# Patient Record
Sex: Female | Born: 2000 | Race: Black or African American | Hispanic: No | Marital: Single | Smoking: Current some day smoker
Health system: Southern US, Community
[De-identification: ages and names within clinical notes are randomized; demographics above are authoritative.]

## PROBLEM LIST (undated history)

## (undated) DIAGNOSIS — N289 Disorder of kidney and ureter, unspecified: Secondary | ICD-10-CM

## (undated) DIAGNOSIS — R011 Cardiac murmur, unspecified: Secondary | ICD-10-CM

## (undated) DIAGNOSIS — M1990 Unspecified osteoarthritis, unspecified site: Secondary | ICD-10-CM

## (undated) DIAGNOSIS — M419 Scoliosis, unspecified: Secondary | ICD-10-CM

## (undated) HISTORY — PX: CYSTOSCOPY: SUR368

---
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
## 2019-08-03 ENCOUNTER — Encounter: Payer: Self-pay | Admitting: Physician Assistant

## 2019-08-03 ENCOUNTER — Ambulatory Visit
Admission: EM | Admit: 2019-08-03 | Discharge: 2019-08-03 | Disposition: A | Discharge status: Home / Self Care | Payer: Medicaid Other | Attending: Physician Assistant | Admitting: Physician Assistant

## 2019-08-03 ENCOUNTER — Other Ambulatory Visit: Payer: Self-pay

## 2019-08-03 ENCOUNTER — Emergency Department (HOSPITAL_COMMUNITY)
Admission: EM | Admit: 2019-08-03 | Discharge: 2019-08-03 | Discharge status: Against Medical Advice | Payer: Medicaid Other

## 2019-08-03 DIAGNOSIS — J0140 Acute pansinusitis, unspecified: Secondary | ICD-10-CM | POA: Diagnosis not present

## 2019-08-03 HISTORY — DX: Scoliosis, unspecified: M41.9

## 2019-08-03 HISTORY — DX: Unspecified osteoarthritis, unspecified site: M19.90

## 2019-08-03 HISTORY — DX: Disorder of kidney and ureter, unspecified: N28.9

## 2019-08-03 HISTORY — DX: Cardiac murmur, unspecified: R01.1

## 2019-08-03 MED ORDER — IPRATROPIUM BROMIDE 0.06 % NA SOLN
2.0000 | Freq: Four times a day (QID) | NASAL | 0 refills | Status: AC
Start: 2019-08-03 — End: ?

## 2019-08-03 MED ORDER — AMOXICILLIN-POT CLAVULANATE 875-125 MG PO TABS: 1.0000 | ORAL_TABLET | Freq: Two times a day (BID) | ORAL | 0 refills | Status: DC

## 2019-08-03 MED ORDER — FLUTICASONE PROPIONATE 50 MCG/ACT NA SUSP: 2.0000 | Freq: Every day | NASAL | 0 refills | Status: AC

## 2019-08-03 NOTE — ED Triage Notes (Signed)
Pt c.o cough, congestion, and body aches x2wks

## 2019-08-03 NOTE — Discharge Instructions (Signed)
Start augmentin as directed. Start flonase, atrovent nasal spray for nasal congestion/drainage. You can use over the counter nasal saline rinse such as neti pot for nasal congestion. Keep hydrated, your urine should be clear to pale yellow in color. Tylenol/motrin for fever and pain. Monitor for any worsening of symptoms, chest pain, shortness of breath, wheezing, swelling of the throat, go to the emergency department for further evaluation needed.

## 2019-08-03 NOTE — ED Provider Notes (Signed)
[facility]    CSN: [id number] Arrival date & time: 08/03/19  1517      History   Chief Complaint Chief Complaint  Patient presents with   Cough    HPI Rebecca Whitney is a 19 y.o. female.   19 year old female comes in for 2+ week history of URI symptoms. Rhinorrhea, nasal congestion, cough, back pain. Denies fever, chills, body aches. Denies abdominal pain, nausea, vomiting, diarrhea. Denies shortness of breath, loss of taste/smell. States now mucous discolored. No COVID vaccine.       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   Past Surgical History:  Procedure Laterality Date   CYSTOSCOPY      OB History   No obstetric history on file.      Home Medications    Prior to Admission medications   Medication Sig Start Date End Date Taking? Authorizing Provider  amoxicillin-clavulanate (AUGMENTIN) 875-125 MG tablet Take 1 tablet by mouth every 12 (twelve) hours. 08/03/19   Cathie Hoops, Jenaye Rickert V, PA-C  fluticasone (FLONASE) 50 MCG/ACT nasal spray Place 2 sprays into both nostrils daily. 08/03/19   Cathie Hoops, Ryne Mctigue V, PA-C  ipratropium (ATROVENT) 0.06 % nasal spray Place 2 sprays into both nostrils 4 (four) times daily. 08/03/19   Cathie Hoops, Letonya Mangels V, PA-C  loratadine (CLARITIN) 10 MG tablet Take 10 mg by mouth daily.  08/03/19  [provider]    Family History History reviewed. No pertinent family history.  Social History Social History   Tobacco Use   Smoking status: Current Some Day Smoker    Types: E-cigarettes   Smokeless tobacco: Never Used  Substance Use Topics   Alcohol use: No   Drug use: Never     Allergies   Patient has no known allergies.   Review of Systems Review of Systems  Reason unable to perform ROS: See HPI as above.     Physical Exam Triage Vital Signs ED Triage Vitals [08/03/19 1601]  Enc  Vitals Group     BP 117/75     Pulse Rate 63     Resp 16     Temp 97.7 F (36.5 C)     Temp Source Oral     SpO2 98 %     Weight      Height      Head Circumference      Peak Flow      Pain Score      Pain Loc      Pain Edu?      Excl. in GC?    No data found.  Updated Vital Signs BP 117/75 (BP Location: Left Arm)    Pulse 63    Temp 97.7 F (36.5 C) (Oral)    Resp 16    LMP 07/20/2019    SpO2 98%   Physical Exam Constitutional:      General: She is not in acute distress.    Appearance: Normal appearance. She is well-developed. She is not ill-appearing, toxic-appearing or diaphoretic.  HENT:     Head: Normocephalic and atraumatic.     Nose: Congestion and rhinorrhea present.     Right Sinus: Maxillary sinus tenderness and frontal sinus tenderness present.     Left Sinus: Maxillary sinus tenderness and frontal sinus tenderness present.  Mouth/Throat:     Mouth: Mucous membranes are moist.     Pharynx: Oropharynx is clear. Uvula midline.  Eyes:     Conjunctiva/sclera: Conjunctivae normal.     Pupils: Pupils are equal, round, and reactive to light.  Cardiovascular:     Rate and Rhythm: Normal rate and regular rhythm.  Pulmonary:     Effort: Pulmonary effort is normal. No accessory muscle usage, prolonged expiration, respiratory distress or retractions.     Breath sounds: No decreased air movement or transmitted upper airway sounds. No decreased breath sounds.     Comments: LCTAB Musculoskeletal:     Cervical back: Normal range of motion and neck supple.  Skin:    General: Skin is warm and dry.  Neurological:     Mental Status: She is alert and oriented to person, place, and time.      UC Treatments / Results  Labs (all labs ordered are listed, but only abnormal results are displayed) Labs Reviewed - No data to display  EKG   Radiology No results found.  Procedures Procedures (including critical care time)  Medications Ordered in UC Medications - No  data to display  Initial Impression / Assessment and Plan / UC Course  I have reviewed the triage vital signs and the nursing notes.  Pertinent labs & imaging results that were available during my care of the patient were reviewed by me and considered in my medical decision making (see chart for details).    Augmentin for sinusitis. Other symptomatic treatment discussed. Return precautions given.   Final Clinical Impressions(s) / UC Diagnoses   Final diagnoses:  Acute non-recurrent pansinusitis    ED Prescriptions    Medication Sig Dispense Auth. Provider   amoxicillin-clavulanate (AUGMENTIN) 875-125 MG tablet Take 1 tablet by mouth every 12 (twelve) hours. 14 tablet Darleth Eustache V, PA-C   fluticasone (FLONASE) 50 MCG/ACT nasal spray Place 2 sprays into both nostrils daily. 1 g Gizella Belleville V, PA-C   ipratropium (ATROVENT) 0.06 % nasal spray Place 2 sprays into both nostrils 4 (four) times daily. 15 mL Belinda Fisher, PA-C     PDMP not reviewed this encounter.   Belinda Fisher, PA-C 08/03/19 [phone]

## 2021-11-25 ENCOUNTER — Ambulatory Visit
Admission: EM | Admit: 2021-11-25 | Discharge: 2021-11-25 | Disposition: A | Discharge status: Home / Self Care | Payer: Medicaid Other | Attending: Physician Assistant | Admitting: Physician Assistant

## 2021-11-25 DIAGNOSIS — J209 Acute bronchitis, unspecified: Secondary | ICD-10-CM | POA: Insufficient documentation

## 2021-11-25 DIAGNOSIS — Z1152 Encounter for screening for COVID-19: Secondary | ICD-10-CM | POA: Insufficient documentation

## 2021-11-25 DIAGNOSIS — J019 Acute sinusitis, unspecified: Secondary | ICD-10-CM | POA: Diagnosis present

## 2021-11-25 MED ORDER — AMOXICILLIN-POT CLAVULANATE 875-125 MG PO TABS: 1.0000 | ORAL_TABLET | Freq: Two times a day (BID) | ORAL | 0 refills | Status: DC

## 2021-11-25 MED ORDER — PREDNISONE 20 MG PO TABS: 40.0000 mg | ORAL_TABLET | Freq: Every day | ORAL | 0 refills | Status: AC

## 2021-11-25 NOTE — ED Provider Notes (Signed)
[facility]    CSN: [id number] Arrival date & time: 11/25/21  1738      History   Chief Complaint Chief Complaint  Patient presents with   Nasal Congestion    HPI Rebecca Whitney is a 21 y.o. female.   Patient here today for evaluation of nasal congestion that she has had for the last 20 days.  She reports that she has had some cough as well and this worsened over the last few days.  She denies any persistent nausea or vomiting but states she did vomit yesterday.  She also notes [place] before symptoms worsen.  She has low-grade fever in office but has not had fever otherwise.  She has taken hydrocodone today as well as amoxicillin she had leftover but no other over-the-counter medication.  The history is provided by the patient.    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   Past Surgical History:  Procedure Laterality Date   CYSTOSCOPY      OB History   No obstetric history on file.      Home Medications    Prior to Admission medications   Medication Sig Start Date End Date Taking? Authorizing Provider  amoxicillin-clavulanate (AUGMENTIN) 875-125 MG tablet Take 1 tablet by mouth every 12 (twelve) hours. 11/25/21  Yes Tomi Bamberger, PA-C  cyclobenzaprine (FLEXERIL) 5 MG tablet  12/10/20  Yes [provider]  gabapentin (NEURONTIN) 300 MG capsule Take by mouth. 09/11/21  Yes [provider]  LO LOESTRIN FE 1 MG-10 MCG / 10 MCG tablet Take 1 tablet by mouth daily. 10/04/21  Yes [provider]  methocarbamol (ROBAXIN) 500 MG tablet Take by mouth. 09/10/21  Yes [provider]  predniSONE (DELTASONE) 20 MG tablet Take 2 tablets (40 mg total) by mouth daily with breakfast for 5 days. 11/25/21 11/30/21 Yes Tomi Bamberger, PA-C  sertraline (ZOLOFT) 25 MG  tablet  12/11/20  Yes [provider]  fluticasone (FLONASE) 50 MCG/ACT nasal spray Place 2 sprays into both nostrils daily. 08/03/19   Belinda Fisher, PA-C  HYDROcodone-acetaminophen (NORCO/VICODIN) 5-325 MG tablet Take 1 tablet by mouth 4 (four) times daily as needed.    [provider]  ipratropium (ATROVENT) 0.06 % nasal spray Place 2 sprays into both nostrils 4 (four) times daily. 08/03/19   Cathie Hoops, Amy V, PA-C  loratadine (CLARITIN) 10 MG tablet Take 10 mg by mouth daily.  08/03/19  [provider]    Family History History reviewed. No pertinent family history.  Social History Social History   Tobacco Use   Smoking status: Some Days    Types: E-cigarettes   Smokeless tobacco: Never  Substance Use Topics   Alcohol use: No   Drug use: Never     Allergies   Patient has no known allergies.   Review of Systems Review of Systems  Constitutional:  Positive for fever. Negative for chills.  HENT:  Positive for congestion and sore throat. Negative for ear pain.   Eyes:  Negative for discharge and redness.  Respiratory:  Positive for cough. Negative for shortness of breath and wheezing.   Gastrointestinal:  Positive for vomiting. Negative for abdominal pain, diarrhea and nausea.     Physical Exam Triage Vital Signs ED  Triage Vitals [11/25/21 1821]  Enc Vitals Group     BP 95/63     Pulse Rate (!) 110     Resp 16     Temp 99.9 F (37.7 C)     Temp Source Oral     SpO2 97 %     Weight      Height      Head Circumference      Peak Flow      Pain Score 6     Pain Loc      Pain Edu?      Excl. in GC?    No data found.  Updated Vital Signs BP 95/63 (BP Location: Right Arm)   Pulse (!) 110   Temp 99.9 F (37.7 C) (Oral)   Resp 16   SpO2 97%     Physical Exam Vitals and nursing note reviewed.  Constitutional:      General: She is not in acute distress.    Appearance: Normal appearance. She is not ill-appearing.  HENT:     Head: Normocephalic  and atraumatic.     Nose: Congestion present.     Mouth/Throat:     Mouth: Mucous membranes are moist.     Pharynx: No oropharyngeal exudate or posterior oropharyngeal erythema.  Eyes:     Conjunctiva/sclera: Conjunctivae normal.  Cardiovascular:     Rate and Rhythm: Normal rate and regular rhythm.     Heart sounds: Normal heart sounds. No murmur heard. Pulmonary:     Effort: Pulmonary effort is normal. No respiratory distress.     Breath sounds: Normal breath sounds. No wheezing, rhonchi or rales.  Skin:    General: Skin is warm and dry.  Neurological:     Mental Status: She is alert.  Psychiatric:        Mood and Affect: Mood normal.        Thought Content: Thought content normal.      UC Treatments / Results  Labs (all labs ordered are listed, but only abnormal results are displayed) Labs Reviewed  RESP PANEL BY RT-PCR (FLU A&B, COVID) [id number]    EKG   Radiology No results found.  Procedures Procedures (including critical care time)  Medications Ordered in UC Medications - No data to display  Initial Impression / Assessment and Plan / UC Course  I have reviewed the triage vital signs and the nursing notes.  Pertinent labs & imaging results that were available during my care of the patient were reviewed by me and considered in my medical decision making (see chart for details).    We will treat to cover both sinusitis and bronchitis with Augmentin and prednisone.  Discussed possibility of additional viral illness given worsening symptoms over the last 2 days with associated fever.  Will order COVID and flu screening.  Recommended symptomatic treatment otherwise with follow-up with any further concerns.  Final Clinical Impressions(s) / UC Diagnoses   Final diagnoses:  Acute sinusitis, recurrence not specified, unspecified location  Acute bronchitis, unspecified organism  Encounter for screening for COVID-19   Discharge Instructions   None    ED  Prescriptions     Medication Sig Dispense Auth. Provider   predniSONE (DELTASONE) 20 MG tablet Take 2 tablets (40 mg total) by mouth daily with breakfast for 5 days. 10 tablet Tomi Bamberger, PA-C   amoxicillin-clavulanate (AUGMENTIN) 875-125 MG tablet Take 1 tablet by mouth every 12 (twelve) hours. 14 tablet Tomi Bamberger, PA-C  PDMP not reviewed this encounter.   Tomi Bamberger, PA-C 11/25/21 1927

## 2021-11-25 NOTE — ED Triage Notes (Signed)
Pt c/o cough, runny nose, sneezing for 20 days. States cough worsened over last 2 days with more nasal congestion.

## 2021-11-26 LAB — RESP PANEL BY RT-PCR (FLU A&B, COVID) ARPGX2
Influenza A by PCR: NEGATIVE
Influenza B by PCR: NEGATIVE
SARS Coronavirus 2 by RT PCR: NEGATIVE

## 2022-05-12 ENCOUNTER — Ambulatory Visit
Admission: EM | Admit: 2022-05-12 | Discharge: 2022-05-12 | Disposition: A | Discharge status: Home / Self Care | Payer: Medicaid Other | Attending: Internal Medicine | Admitting: Internal Medicine

## 2022-05-12 DIAGNOSIS — J069 Acute upper respiratory infection, unspecified: Secondary | ICD-10-CM | POA: Diagnosis not present

## 2022-05-12 MED ORDER — BENZONATATE 100 MG PO CAPS: 100.0000 mg | ORAL_CAPSULE | Freq: Three times a day (TID) | ORAL | 0 refills | Status: DC

## 2022-05-12 MED ORDER — PROMETHAZINE-DM 6.25-15 MG/5ML PO SYRP: 5.0000 mL | ORAL_SOLUTION | Freq: Every evening | ORAL | 0 refills | Status: DC | PRN

## 2022-05-12 NOTE — Discharge Instructions (Addendum)
You have a viral upper respiratory infection.   Use the following medicines to help with symptoms: - Plain Mucinex (guaifenesin) over the counter as directed every 12 hours to thin mucous so that you are able to get it out of your body easier. Drink plenty of water while taking this medication so that it works well in your body (at least 8 cups a day).  - Tylenol 1,000mg and/or ibuprofen 600mg every 6 hours with food as needed for aches/pains or fever/chills.  - Tessalon perles every 8 hours as needed for cough.  1 tablespoon of honey in warm water and/or salt water gargles may also help with symptoms. Humidifier to your room will help add water to the air and reduce coughing.  If you develop any new or worsening symptoms, please return.  If your symptoms are severe, please go to the emergency room.  Follow-up with your primary care provider for further evaluation and management of your symptoms as well as ongoing wellness visits.  I hope you feel better!  

## 2022-05-12 NOTE — ED Provider Notes (Signed)
[facility]    CSN: [id number] Arrival date & time: 05/12/22  1618      History   Chief Complaint Chief Complaint  Patient presents with   Cough    HPI Rebecca Whitney is a 22 y.o. female.   Patient presents to urgent care for evaluation of cough, nasal congestion, chills, generalized fatigue, and sore throat for the last 3 days. 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  Cough is productive with phlegm but worse at nighttime.  No shortness of breath, chest pain, and/V/D, abdominal pain, rash, or palpitations, recent antibiotic/steroid use, or history of chronic respiratory problems.  Non-smoker, denies drug use.  She has been using over-the-counter medications to help with symptoms before coming to urgent care with some relief.   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   Past Surgical History:  Procedure Laterality Date   CYSTOSCOPY      OB History   No obstetric history on file.      Home Medications    Prior to Admission medications   Medication Sig Start Date End Date Taking? Authorizing Provider  benzonatate (TESSALON) 100 MG capsule Take 1 capsule (100 mg total) by mouth every 8 (eight) hours. 05/12/22  Yes Carlisle Beers, FNP  promethazine-dextromethorphan (PROMETHAZINE-DM) 6.25-15 MG/5ML syrup Take 5 mLs by mouth at bedtime as needed for cough. 05/12/22  Yes Carlisle Beers, FNP  amoxicillin-clavulanate (AUGMENTIN) 875-125 MG tablet Take 1 tablet by mouth every 12 (twelve) hours. 11/25/21   Tomi Bamberger, PA-C  cyclobenzaprine (FLEXERIL) 5 MG tablet  12/10/20   [provider]  fluticasone (FLONASE) 50 MCG/ACT nasal spray Place 2 sprays into both nostrils daily. 08/03/19   Cathie Hoops, Amy V, PA-C  gabapentin (NEURONTIN) 300 MG capsule Take by mouth.  09/11/21   [provider]  HYDROcodone-acetaminophen (NORCO/VICODIN) 5-325 MG tablet Take 1 tablet by mouth 4 (four) times daily as needed.    [provider]  ipratropium (ATROVENT) 0.06 % nasal spray Place 2 sprays into both nostrils 4 (four) times daily. 08/03/19   Yu, Amy V, PA-C  LO LOESTRIN FE 1 MG-10 MCG / 10 MCG tablet Take 1 tablet by mouth daily. 10/04/21   [provider]  methocarbamol (ROBAXIN) 500 MG tablet Take by mouth. 09/10/21   [provider]  sertraline (ZOLOFT) 25 MG tablet  12/11/20   [provider]  loratadine (CLARITIN) 10 MG tablet Take 10 mg by mouth daily.  08/03/19  [provider]    Family History History reviewed. No pertinent family history.  Social History Social History   Tobacco Use   Smoking status: Some Days    Types: E-cigarettes   Smokeless tobacco: Never  Substance Use Topics   Alcohol use: No   Drug use: Never     Allergies   Patient has no known allergies.   Review of Systems Review of Systems  Respiratory:  Positive for cough.   Per HPI   Physical Exam Triage Vital Signs ED Triage Vitals  Enc Vitals Group     BP 05/12/22 1644 120/80     Pulse Rate 05/12/22 1644 100     Resp 05/12/22 1644 16     Temp  05/12/22 1644 98.4 F (36.9 C)     Temp Source 05/12/22 1644 Oral     SpO2 05/12/22 1644 97 %     Weight --      Height --      Head Circumference --      Peak Flow --      Pain Score 05/12/22 1645 0     Pain Loc --      Pain Edu? --      Excl. in GC? --    No data found.  Updated Vital Signs BP 120/80 (BP Location: Right Arm)   Pulse 100   Temp 98.4 F (36.9 C) (Oral)   Resp 16   SpO2 97%   Visual Acuity Right Eye Distance:   Left Eye Distance:   Bilateral Distance:    Right Eye Near:   Left Eye Near:    Bilateral Near:     Physical Exam Vitals and nursing note reviewed.  Constitutional:      Appearance: She is not ill-appearing or toxic-appearing.   HENT:     Head: Normocephalic and atraumatic.     Right Ear: Hearing, tympanic membrane, ear canal and external ear normal.     Left Ear: Hearing, tympanic membrane, ear canal and external ear normal.     Nose: Congestion present.     Mouth/Throat:     Lips: Pink.     Mouth: Mucous membranes are moist. No injury.     Tongue: No lesions. Tongue does not deviate from midline.     Palate: No mass and lesions.     Pharynx: Oropharynx is clear. Uvula midline. Posterior oropharyngeal erythema present. No pharyngeal swelling, oropharyngeal exudate or uvula swelling.     Tonsils: No tonsillar exudate or tonsillar abscesses.  Eyes:     General: Lids are normal. Vision grossly intact. Gaze aligned appropriately.     Extraocular Movements: Extraocular movements intact.     Conjunctiva/sclera: Conjunctivae normal.  Cardiovascular:     Rate and Rhythm: Normal rate and regular rhythm.     Heart sounds: Normal heart sounds, S1 normal and S2 normal.  Pulmonary:     Effort: Pulmonary effort is normal. No respiratory distress.     Breath sounds: Normal breath sounds and air entry. No stridor. No wheezing, rhonchi or rales.  Chest:     Chest wall: No tenderness.  Musculoskeletal:     Cervical back: Neck supple.     Right lower leg: No edema.     Left lower leg: No edema.  Lymphadenopathy:     Cervical: No cervical adenopathy.  Skin:    General: Skin is warm and dry.     Capillary Refill: Capillary refill takes less than 2 seconds.     Findings: No rash.  Neurological:     General: No focal deficit present.     Mental Status: She is alert and oriented to person, place, and time. Mental status is at baseline.     Cranial Nerves: No dysarthria or facial asymmetry.  Psychiatric:        Mood and Affect: Mood normal.        Speech: Speech normal.        Behavior: Behavior normal.        Thought Content: Thought content normal.        Judgment: Judgment normal.      UC Treatments / Results   Labs (all labs ordered are listed, but only abnormal results are displayed) Labs  Reviewed - No data to display  EKG   Radiology No results found.  Procedures Procedures (including critical care time)  Medications Ordered in UC Medications - No data to display  Initial Impression / Assessment and Plan / UC Course  I have reviewed the triage vital signs and the nursing notes.  Pertinent labs & imaging results that were available during my care of the patient were reviewed by me and considered in my medical decision making (see chart for details).   1.  Viral URI with cough Symptoms and physical exam consistent with a viral upper respiratory tract infection that will likely resolve with rest, fluids, and prescriptions for symptomatic relief. Deferred imaging based on stable cardiopulmonary exam and hemodynamically stable vital signs. Deferred viral testing using shared decision making as patient is low risk for severe disease related to potential COVID-19 illness and there is low suspicion for influenza. Discussed updated CDC guidelines for masking related to COVID-19.   Tessalon Perles and Promethazine DM sent to pharmacy for symptomatic relief to be taken as prescribed.  May continue taking over the counter medications as directed for further symptomatic relief.  Drowsiness precautions discussed regarding promethazine DM prescription.  Nonpharmacologic interventions for symptom relief provided and after visit summary below. Advised to push fluids to stay well hydrated while recovering from viral illness.   Discussed physical exam and available lab work findings in clinic with patient.  Counseled patient regarding appropriate use of medications and potential side effects for all medications recommended or prescribed today. Discussed red flag signs and symptoms of worsening condition,when to call the PCP office, return to urgent care, and when to seek higher level of care in the emergency  department. Patient verbalizes understanding and agreement with plan. All questions answered. Patient discharged in stable condition.    Final Clinical Impressions(s) / UC Diagnoses   Final diagnoses:  Viral URI with cough     Discharge Instructions      You have a viral upper respiratory infection.   Use the following medicines to help with symptoms: - Plain Mucinex (guaifenesin) over the counter as directed every 12 hours to thin mucous so that you are able to get it out of your body easier. Drink plenty of water while taking this medication so that it works well in your body (at least 8 cups a day).  - Tylenol 1,000mg  and/or ibuprofen 600mg  every 6 hours with food as needed for aches/pains or fever/chills.  - Tessalon perles every 8 hours as needed for cough.  1 tablespoon of honey in warm water and/or salt water gargles may also help with symptoms. Humidifier to your room will help add water to the air and reduce coughing.  If you develop any new or worsening symptoms, please return.  If your symptoms are severe, please go to the emergency room.  Follow-up with your primary care provider for further evaluation and management of your symptoms as well as ongoing wellness visits.  I hope you feel better!   ED Prescriptions     Medication Sig Dispense Auth. Provider   benzonatate (TESSALON) 100 MG capsule Take 1 capsule (100 mg total) by mouth every 8 (eight) hours. 21 capsule Carlisle Beers, FNP   promethazine-dextromethorphan (PROMETHAZINE-DM) 6.25-15 MG/5ML syrup Take 5 mLs by mouth at bedtime as needed for cough. 118 mL Carlisle Beers, FNP      PDMP not reviewed this encounter.   Carlisle Beers, [place] 05/12/22 1727

## 2022-05-12 NOTE — ED Triage Notes (Signed)
Pt c/o cough onset ~ 3 weeks ago states over the last 1-2 days it has gotten worse. States new sxs include body aches, headaches. Reports taking hydrocodone "which really helps."

## 2023-02-19 ENCOUNTER — Ambulatory Visit
Admission: EM | Admit: 2023-02-19 | Discharge: 2023-02-19 | Disposition: A | Discharge status: Home / Self Care | Payer: Medicaid Other | Attending: Family Medicine | Admitting: Family Medicine

## 2023-02-19 DIAGNOSIS — H60502 Unspecified acute noninfective otitis externa, left ear: Secondary | ICD-10-CM | POA: Diagnosis not present

## 2023-02-19 MED ORDER — NEOMYCIN-POLYMYXIN-HC 3.5-10000-1 OT SUSP: 4.0000 [drp] | Freq: Three times a day (TID) | OTIC | 0 refills | Status: AC

## 2023-02-19 NOTE — ED Triage Notes (Signed)
Pt presents with ear pain in left ear. Symptoms onset 3 to 4 days ago. Pt states earlier she could not hear out of the ear.

## 2023-02-19 NOTE — ED Provider Notes (Signed)
[facility]    CSN: [id number] Arrival date & time: 02/19/23  1526      History   Chief Complaint Chief Complaint  Patient presents with   Otalgia    HPI Rebecca Whitney is a 23 y.o. female.  Patient presents today with a concern for an ear infection.  Patient endorses that she has had left ear pain and intermittent of periods hearing loss.  Patient has had mild upper respiratory symptoms on and off for a while.  Ear pain has been ongoing for 3 days.  She has had no fever.  She has no history of recurrent ear infections.   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   Past Surgical History:  Procedure Laterality Date   CYSTOSCOPY      OB History   No obstetric history on file.      Home Medications    Prior to Admission medications   Medication Sig Start Date End Date Taking? Authorizing Provider  neomycin-polymyxin-hydrocortisone (CORTISPORIN) 3.5-10000-1 OTIC suspension Place 4 drops into the left ear 3 (three) times daily for 7 days. 02/19/23 02/26/23 Yes Bing Neighbors, NP  amoxicillin-clavulanate (AUGMENTIN) 875-125 MG tablet Take 1 tablet by mouth every 12 (twelve) hours. 11/25/21   Tomi Bamberger, PA-C  benzonatate (TESSALON) 100 MG capsule Take 1 capsule (100 mg total) by mouth every 8 (eight) hours. 05/12/22   Carlisle Beers, FNP  cyclobenzaprine (FLEXERIL) 5 MG tablet  12/10/20   [provider]  fluticasone (FLONASE) 50 MCG/ACT nasal spray Place 2 sprays into both nostrils daily. 08/03/19   Cathie Hoops, Amy V, PA-C  gabapentin (NEURONTIN) 300 MG capsule Take by mouth. 09/11/21   [provider]  HYDROcodone-acetaminophen (NORCO/VICODIN) 5-325 MG tablet Take 1 tablet by mouth 4 (four) times daily as needed.    [provider]  ipratropium (ATROVENT) 0.06 % nasal spray Place 2 sprays into  both nostrils 4 (four) times daily. 08/03/19   Yu, Amy V, PA-C  LO LOESTRIN FE 1 MG-10 MCG / 10 MCG tablet Take 1 tablet by mouth daily. 10/04/21   [provider]  methocarbamol (ROBAXIN) 500 MG tablet Take by mouth. 09/10/21   [provider]  promethazine-dextromethorphan (PROMETHAZINE-DM) 6.25-15 MG/5ML syrup Take 5 mLs by mouth at bedtime as needed for cough. 05/12/22   Carlisle Beers, FNP  sertraline (ZOLOFT) 25 MG tablet  12/11/20   [provider]  loratadine (CLARITIN) 10 MG tablet Take 10 mg by mouth daily.  08/03/19  [provider]    Family History No family history on file.  Social History Social History   Tobacco Use   Smoking status: Some Days    Types: E-cigarettes   Smokeless tobacco: Never  Substance Use Topics   Alcohol use: No   Drug use: Never     Allergies   Patient has no known allergies.   Review of Systems Review of Systems  HENT:  Positive for ear pain.      Physical Exam Triage Vital Signs ED Triage Vitals  Encounter Vitals Group     BP 02/19/23 1704 118/81     Systolic BP Percentile --      Diastolic BP Percentile --      Pulse Rate 02/19/23 1704 75  Resp 02/19/23 1704 18     Temp 02/19/23 1704 98.1 F (36.7 C)     Temp Source 02/19/23 1704 Oral     SpO2 02/19/23 1704 95 %     Weight --      Height --      Head Circumference --      Peak Flow --      Pain Score 02/19/23 1702 0     Pain Loc --      Pain Education --      Exclude from Growth Chart --    No data found.  Updated Vital Signs BP 118/81 (BP Location: Right Arm)   Pulse 75   Temp 98.1 F (36.7 C) (Oral)   Resp 18   LMP 02/05/2023   SpO2 95%   Visual Acuity Right Eye Distance:   Left Eye Distance:   Bilateral Distance:    Right Eye Near:   Left Eye Near:    Bilateral Near:     Physical Exam Vitals reviewed.  HENT:     Right Ear: Hearing, tympanic membrane, ear canal and external ear normal.     Left Ear: Swelling  and tenderness present. Tympanic membrane is erythematous.  Eyes:     Extraocular Movements: Extraocular movements intact.     Conjunctiva/sclera: Conjunctivae normal.     Pupils: Pupils are equal, round, and reactive to light.  Cardiovascular:     Rate and Rhythm: Normal rate and regular rhythm.  Pulmonary:     Effort: Pulmonary effort is normal.     Breath sounds: Normal breath sounds.  Musculoskeletal:        General: Normal range of motion.     Cervical back: Normal range of motion and neck supple.  Skin:    General: Skin is warm and dry.  Neurological:     General: No focal deficit present.     Mental Status: She is alert.      UC Treatments / Results  Labs (all labs ordered are listed, but only abnormal results are displayed) Labs Reviewed - No data to display  EKG   Radiology No results found.  Procedures Procedures (including critical care time)  Medications Ordered in UC Medications - No data to display  Initial Impression / Assessment and Plan / UC Course  I have reviewed the triage vital signs and the nursing notes.  Pertinent labs & imaging results that were available during my care of the patient were reviewed by me and considered in my medical decision making (see chart for details).    Acute otitis media involving the left ear.  Treatment with Cortisporin 4 drops to left ear 3 times daily for 7 days. Recommended cetirizine for nasal symptoms.  Return precautions given if symptoms worsen or do not improve. Final Clinical Impressions(s) / UC Diagnoses   Final diagnoses:  Acute otitis externa of left ear, unspecified type     Discharge Instructions      Need for a left infection.  No eardrops as directed.  Symptoms should gradually improve however complete entire 7 days of treatment.     ED Prescriptions     Medication Sig Dispense Auth. Provider   neomycin-polymyxin-hydrocortisone (CORTISPORIN) 3.5-10000-1 OTIC suspension Place 4 drops into  the left ear 3 (three) times daily for 7 days. 4.2 mL Bing Neighbors, NP      PDMP not reviewed this encounter.   Bing Neighbors, NP 02/19/23 1734

## 2023-02-19 NOTE — Discharge Instructions (Signed)
Need for a left infection.  No eardrops as directed.  Symptoms should gradually improve however complete entire 7 days of treatment.

## 2023-02-26 ENCOUNTER — Ambulatory Visit
Admission: RE | Admit: 2023-02-26 | Discharge: 2023-02-26 | Disposition: A | Discharge status: Home / Self Care | Payer: Medicaid Other | Source: Ambulatory Visit | Attending: Family Medicine | Admitting: Family Medicine

## 2023-02-26 VITALS — BP 97/66 | HR 107 | Temp 98.4°F | Resp 18 | Ht 70.0 in | Wt 140.0 lb

## 2023-02-26 DIAGNOSIS — J09X2 Influenza due to identified novel influenza A virus with other respiratory manifestations: Secondary | ICD-10-CM | POA: Diagnosis not present

## 2023-02-26 LAB — POCT URINALYSIS DIP (MANUAL ENTRY)
Bilirubin, UA: NEGATIVE
Blood, UA: NEGATIVE
Glucose, UA: NEGATIVE mg/dL
Leukocytes, UA: NEGATIVE
Nitrite, UA: NEGATIVE
Protein Ur, POC: 100 mg/dL — AB
Spec Grav, UA: 1.02 (ref 1.010–1.025)
Urobilinogen, UA: 1 U/dL
pH, UA: 6 (ref 5.0–8.0)

## 2023-02-26 LAB — POC COVID19/FLU A&B COMBO
Covid Antigen, POC: NEGATIVE
Influenza A Antigen, POC: POSITIVE — AB
Influenza B Antigen, POC: NEGATIVE

## 2023-02-26 MED ORDER — PROMETHAZINE-DM 6.25-15 MG/5ML PO SYRP: 5.0000 mL | ORAL_SOLUTION | Freq: Four times a day (QID) | ORAL | 0 refills | Status: AC | PRN

## 2023-02-26 MED ORDER — IBUPROFEN 600 MG PO TABS: 600.0000 mg | ORAL_TABLET | Freq: Three times a day (TID) | ORAL | 0 refills | Status: AC | PRN

## 2023-02-26 MED ORDER — OSELTAMIVIR PHOSPHATE 75 MG PO CAPS: 75.0000 mg | ORAL_CAPSULE | Freq: Two times a day (BID) | ORAL | 0 refills | Status: AC

## 2023-02-26 MED ORDER — ONDANSETRON 4 MG PO TBDP: 4.0000 mg | ORAL_TABLET | Freq: Three times a day (TID) | ORAL | 0 refills | Status: AC | PRN

## 2023-02-26 NOTE — Discharge Instructions (Addendum)
Your flu test is positive; COVID test was negative  Take oseltamivir 75 mg--1 capsule 2 times daily for 5 days  Ondansetron dissolved in the mouth every 8 hours as needed for nausea or vomiting. Clear liquids(water, gatorade/pedialyte, ginger ale/sprite, chicken broth/soup) and bland things(crackers/toast, rice, potato, bananas) to eat. Avoid acidic foods like lemon/lime/orange/tomato, and avoid greasy/spicy foods.  Take Phenergan with dextromethorphan syrup--5 mL or 1 teaspoon every 6 hours as needed for cough  Take ibuprofen 600 mg--1 tab every 8 hours as needed for pain.  The urine analysis did not show any signs of infection.

## 2023-02-26 NOTE — ED Provider Notes (Addendum)
[facility]    CSN: [id number] Arrival date & time: 02/26/23  1300      History   Chief Complaint Chief Complaint  Patient presents with   Cough    HPI Rebecca Whitney is a 23 y.o. female.    Cough Here for cough and congestion and sore throat.  She is also had some subjective fever and myalgia.  Symptoms began on February 17.  She has had some nausea but no vomiting.  No diarrhea.  Her left low back has been hurting and also the left side.  No dysuria no hematuria.  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      Home Medications    Prior to Admission medications   Medication Sig Start Date End Date Taking? Authorizing Provider  acetaminophen-codeine (TYLENOL #4) 300-60 MG tablet Take 1-2 tablets by mouth every 6 (six) hours as needed. 08/20/17  Yes [provider]  Cholecalciferol 50 MCG (2000 UT) CAPS Take 4,000 Units by mouth daily at 2 PM. 05/16/17  Yes [provider]  clotrimazole (LOTRIMIN) 1 % cream Apply 1 Application topically 2 (two) times daily. 11/10/22  Yes [provider]  ferrous sulfate 325 (65 FE) MG EC tablet Take 325 mg by mouth 3 (three) times daily with meals. 05/16/17  Yes [provider]  HYDROcodone-acetaminophen (NORCO/VICODIN) 5-325 MG tablet Take 1 tablet by mouth 4 (four) times daily as needed.   Yes [provider]  HYDROcodone-acetaminophen (NORCO/VICODIN)  5-325 MG tablet Take 1-2 tablets by mouth 3 (three) times daily. 01/10/21  Yes [provider]  ibuprofen (ADVIL) 600 MG tablet Take 1 tablet (600 mg total) by mouth every 8 (eight) hours as needed (pain). 02/26/23  Yes Jiovany Scheffel, Janace Aris, MD  LO LOESTRIN FE 1 MG-10 MCG / 10 MCG tablet Take 1 tablet by mouth daily. 10/04/21  Yes [provider]  methocarbamol (ROBAXIN) 750 MG tablet Take 750 mg by mouth 3 (three) times daily. 09/13/22  Yes [provider]  ondansetron (ZOFRAN-ODT) 4 MG disintegrating tablet Take 1 tablet (4 mg total) by mouth every 8 (eight) hours as needed for nausea or vomiting. 02/26/23  Yes Prinston Kynard, Janace Aris, MD  oseltamivir (TAMIFLU) 75 MG capsule Take 1 capsule (75 mg total) by mouth every 12 (twelve) hours. 02/26/23  Yes Zenia Resides, MD  promethazine-dextromethorphan (PROMETHAZINE-DM) 6.25-15 MG/5ML syrup Take 5 mLs by mouth 4 (four) times daily as needed for cough. 02/26/23  Yes Zenia Resides, MD  sertraline (ZOLOFT) 50 MG tablet Take 50 mg by mouth daily. 02/02/23  Yes [provider]  Vitamin D, Ergocalciferol, (DRISDOL) 1.25 MG (50000 UNIT) CAPS capsule Take 50,000 Units by mouth once a week. 02/02/23  Yes [provider]  cyclobenzaprine (FLEXERIL) 5 MG tablet  12/10/20   [provider]  fluticasone (FLONASE) 50 MCG/ACT nasal spray  Place 2 sprays into both nostrils daily. 08/03/19   Cathie Hoops, Amy V, PA-C  gabapentin (NEURONTIN) 300 MG capsule Take by mouth. 09/11/21   [provider]  ipratropium (ATROVENT) 0.06 % nasal spray Place 2 sprays into both nostrils 4 (four) times daily. 08/03/19   Cathie Hoops, Amy V, PA-C  methocarbamol (ROBAXIN) 500 MG tablet Take by mouth. 09/10/21   [provider]  neomycin-polymyxin-hydrocortisone (CORTISPORIN) 3.5-10000-1 OTIC suspension Place 4 drops into the left ear 3 (three) times daily for 7 days. 02/19/23 02/26/23  Bing Neighbors, NP  sertraline (ZOLOFT) 25 MG tablet  12/11/20    [provider]  loratadine (CLARITIN) 10 MG tablet Take 10 mg by mouth daily.  08/03/19  [provider]    Family History History reviewed. No pertinent family history.  Social History Social History   Tobacco Use   Smoking status: Never   Smokeless tobacco: Never  Vaping Use   Vaping status: Some Days   Substances: Nicotine, Flavoring  Substance Use Topics   Alcohol use: No   Drug use: Never     Allergies   Patient has no known allergies.   Review of Systems Review of Systems  Respiratory:  Positive for cough.      Physical Exam Triage Vital Signs ED Triage Vitals  Encounter Vitals Group     BP 02/26/23 1334 97/66     Systolic BP Percentile --      Diastolic BP Percentile --      Pulse Rate 02/26/23 1334 (!) 107     Resp 02/26/23 1334 18     Temp 02/26/23 1334 98.4 F (36.9 C)     Temp Source 02/26/23 1334 Oral     SpO2 02/26/23 1334 96 %     Weight 02/26/23 1331 140 lb (63.5 kg)     Height 02/26/23 1331 5\' 10"  (1.778 m)     Head Circumference --      Peak Flow --      Pain Score 02/26/23 1331 4     Pain Loc --      Pain Education --      Exclude from Growth Chart --    No data found.  Updated Vital Signs BP 97/66 (BP Location: Right Arm)   Pulse (!) 107   Temp 98.4 F (36.9 C) (Oral)   Resp 18   Ht 5\' 10"  (1.778 m)   Wt 63.5 kg   LMP 02/05/2023 (Exact Date)   SpO2 96%   BMI 20.09 kg/m   Visual Acuity Right Eye Distance:   Left Eye Distance:   Bilateral Distance:    Right Eye Near:   Left Eye Near:    Bilateral Near:     Physical Exam Vitals reviewed.  Constitutional:      General: She is not in acute distress.    Appearance: She is not toxic-appearing.  HENT:     Right Ear: Tympanic membrane and ear canal normal.     Left Ear: Tympanic membrane and ear canal normal.     Nose: Nose normal.     Mouth/Throat:     Mouth: Mucous membranes are moist.     Pharynx: No oropharyngeal exudate or posterior oropharyngeal  erythema.  Eyes:     Extraocular Movements: Extraocular movements intact.     Conjunctiva/sclera: Conjunctivae normal.     Pupils: Pupils are equal, round, and reactive to light.  Cardiovascular:     Rate and Rhythm: Normal rate and regular rhythm.  Heart sounds: No murmur heard. Pulmonary:     Effort: Pulmonary effort is normal. No respiratory distress.     Breath sounds: No stridor. No wheezing, rhonchi or rales.  Abdominal:     Palpations: Abdomen is soft.     Tenderness: There is no abdominal tenderness.     Comments: There is a little tenderness along the left lower rib cage in the mid axillary line.  Musculoskeletal:     Cervical back: Neck supple.  Lymphadenopathy:     Cervical: No cervical adenopathy.  Skin:    Capillary Refill: Capillary refill takes less than 2 seconds.     Coloration: Skin is not jaundiced or pale.  Neurological:     General: No focal deficit present.     Mental Status: She is alert and oriented to person, place, and time.  Psychiatric:        Behavior: Behavior normal.      UC Treatments / Results  Labs (all labs ordered are listed, but only abnormal results are displayed) Labs Reviewed  POCT URINALYSIS DIP (MANUAL ENTRY) - Abnormal; Notable for the following components:      Result Value   Color, UA other (*)    Clarity, UA cloudy (*)    Ketones, POC UA small (15) (*)    Protein Ur, POC =100 (*)    All other components within normal limits  POC COVID19/FLU A&B COMBO - Abnormal; Notable for the following components:   Influenza A Antigen, POC Positive (*)    All other components within normal limits    EKG   Radiology No results found.  Procedures Procedures (including critical care time)  Medications Ordered in UC Medications - No data to display  Initial Impression / Assessment and Plan / UC Course  I have reviewed the triage vital signs and the nursing notes.  Pertinent labs & imaging results that were available during  my care of the patient were reviewed by me and considered in my medical decision making (see chart for details).     Urinalysis shows ketones and protein, but no nitrites, leuks, or red blood cells.  Test is positive for influenza A and negative for COVID.  Tamiflu was sent in to treat this is is within the 72-hour window.  Ibuprofen is sent in for her myalgia and headache.  Zofran is sent in for the nausea Final Clinical Impressions(s) / UC Diagnoses   Final diagnoses:  Influenza due to identified novel influenza A virus with other respiratory manifestations     Discharge Instructions      Your flu test is positive; COVID test was negative  Take oseltamivir 75 mg--1 capsule 2 times daily for 5 days  Ondansetron dissolved in the mouth every 8 hours as needed for nausea or vomiting. Clear liquids(water, gatorade/pedialyte, ginger ale/sprite, chicken broth/soup) and bland things(crackers/toast, rice, potato, bananas) to eat. Avoid acidic foods like lemon/lime/orange/tomato, and avoid greasy/spicy foods.  Take Phenergan with dextromethorphan syrup--5 mL or 1 teaspoon every 6 hours as needed for cough  Take ibuprofen 600 mg--1 tab every 8 hours as needed for pain.  The urine analysis did not show any signs of infection.        ED Prescriptions     Medication Sig Dispense Auth. Provider   oseltamivir (TAMIFLU) 75 MG capsule Take 1 capsule (75 mg total) by mouth every 12 (twelve) hours. 10 capsule Zenia Resides, MD   ondansetron (ZOFRAN-ODT) 4 MG disintegrating tablet Take 1 tablet (4 mg  total) by mouth every 8 (eight) hours as needed for nausea or vomiting. 10 tablet Zenia Resides, MD   promethazine-dextromethorphan (PROMETHAZINE-DM) 6.25-15 MG/5ML syrup Take 5 mLs by mouth 4 (four) times daily as needed for cough. 118 mL Zenia Resides, MD   ibuprofen (ADVIL) 600 MG tablet Take 1 tablet (600 mg total) by mouth every 8 (eight) hours as needed (pain). 15 tablet  Clarisse Rodriges, Janace Aris, MD      PDMP not reviewed this encounter.   Zenia Resides, MD 02/26/23 1416    Zenia Resides, MD 02/26/23 [phone]

## 2023-02-26 NOTE — ED Triage Notes (Signed)
I have flu like symptoms - Entered by patient  "This started with waking up with sore throat in the mornings, on Monday started having this all day with runny nose, cough, ha at night and severe kidney pain at night". No dysuria. "Probably a fever at night recently, none now".
